# Patient Record
Sex: Male | Born: 1990 | Race: White | Hispanic: No | Marital: Single | Smoking: Never smoker
Health system: Southern US, Community
[De-identification: ages and names within clinical notes are randomized; demographics above are authoritative.]

## PROBLEM LIST (undated history)

## (undated) ENCOUNTER — Ambulatory Visit (HOSPITAL_COMMUNITY): Disposition: A | Discharge status: Home / Self Care | Payer: Self-pay

---
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
## 2011-01-31 ENCOUNTER — Emergency Department (HOSPITAL_COMMUNITY): Payer: 59

## 2011-01-31 ENCOUNTER — Emergency Department (HOSPITAL_COMMUNITY)
Admission: EM | Admit: 2011-01-31 | Discharge: 2011-01-31 | Disposition: A | Discharge status: Home / Self Care | Payer: 59 | Attending: Emergency Medicine | Admitting: Emergency Medicine

## 2011-01-31 DIAGNOSIS — M79609 Pain in unspecified limb: Secondary | ICD-10-CM | POA: Insufficient documentation

## 2011-01-31 DIAGNOSIS — S62309A Unspecified fracture of unspecified metacarpal bone, initial encounter for closed fracture: Secondary | ICD-10-CM | POA: Insufficient documentation

## 2011-01-31 DIAGNOSIS — M7989 Other specified soft tissue disorders: Secondary | ICD-10-CM | POA: Insufficient documentation

## 2011-01-31 DIAGNOSIS — X838XXA Intentional self-harm by other specified means, initial encounter: Secondary | ICD-10-CM | POA: Insufficient documentation

## 2011-01-31 LAB — GLUCOSE, CAPILLARY: Glucose-Capillary: 75 mg/dL (ref 70–99)

## 2019-03-08 ENCOUNTER — Emergency Department (HOSPITAL_COMMUNITY)
Admission: EM | Admit: 2019-03-08 | Discharge: 2019-03-08 | Disposition: A | Discharge status: Home / Self Care | Payer: 59 | Attending: Emergency Medicine | Admitting: Emergency Medicine

## 2019-03-08 ENCOUNTER — Encounter (HOSPITAL_COMMUNITY): Payer: Self-pay

## 2019-03-08 ENCOUNTER — Other Ambulatory Visit: Payer: Self-pay

## 2019-03-08 DIAGNOSIS — S80811A Abrasion, right lower leg, initial encounter: Secondary | ICD-10-CM | POA: Insufficient documentation

## 2019-03-08 DIAGNOSIS — W5911XA Bitten by nonvenomous snake, initial encounter: Secondary | ICD-10-CM

## 2019-03-08 DIAGNOSIS — X58XXXA Exposure to other specified factors, initial encounter: Secondary | ICD-10-CM | POA: Diagnosis not present

## 2019-03-08 DIAGNOSIS — Y93H2 Activity, gardening and landscaping: Secondary | ICD-10-CM | POA: Insufficient documentation

## 2019-03-08 DIAGNOSIS — Y92017 Garden or yard in single-family (private) house as the place of occurrence of the external cause: Secondary | ICD-10-CM | POA: Diagnosis not present

## 2019-03-08 DIAGNOSIS — Y999 Unspecified external cause status: Secondary | ICD-10-CM | POA: Diagnosis not present

## 2019-03-08 DIAGNOSIS — S8991XA Unspecified injury of right lower leg, initial encounter: Secondary | ICD-10-CM | POA: Diagnosis present

## 2019-03-08 NOTE — Discharge Instructions (Addendum)
If you were bitten by a snake, it appears that no envenomation occurred.  Please continue to monitor your symptoms, if you notice significant swelling, redness, or increased pain return to the emergency department.

## 2019-03-08 NOTE — ED Triage Notes (Signed)
Pt arrives POV for eval of R lateral leg pain d/t suspected snake bit to R leg. Pt states he felt sharp pain while mowing lawn earlier, but did not see what bit him, Pt states that it was approx 3-4 hours ago. 2 small red marks noted to R leg w/ no surrounding erythema, edema, streaking. Denies acute pain.

## 2019-03-08 NOTE — ED Provider Notes (Signed)
[facility] EMERGENCY DEPARTMENT Provider Note   CSN: [id number] Arrival date & time: 03/08/19  2146     History   Chief Complaint Chief Complaint  Patient presents with  . Snake Bite    HPI Carlos Pearson is a 28 y.o. male.     Patient presents to the emergency department with a chief complaint of questionable snake bite.  He states he was mowing grass and using a string tremor in long weeds, and may have been bitten by a snake.  He states that the injury occurred approximately 3 to 4 hours ago.  He states that after he finished she noticed 2 small abrasions on his right lower leg.  He denies any swelling or erythema.  Denies any pain.  The history is provided by the patient. No language interpreter was used.    History reviewed. No pertinent past medical history.  There are no active problems to display for this patient.   History reviewed. No pertinent surgical history.      Home Medications    Prior to Admission medications   Not on File    Family History History reviewed. No pertinent family history.  Social History Social History   Tobacco Use  . Smoking status: Never Smoker  . Smokeless tobacco: Never Used  Substance Use Topics  . Alcohol use: Yes  . Drug use: Never     Allergies   Patient has no known allergies.   Review of Systems Review of Systems  All other systems reviewed and are negative.    Physical Exam Updated Vital Signs BP 124/72   Pulse 74   Temp 98.2 F (36.8 C) (Oral)   Resp 18   Ht 6\' 2"  (1.88 m)   Wt 81.6 kg   SpO2 98%   BMI 23.11 kg/m   Physical Exam Vitals signs and nursing note reviewed.  Constitutional:      Appearance: He is well-developed.  HENT:     Head: Normocephalic and atraumatic.  Eyes:     Conjunctiva/sclera: Conjunctivae normal.  Neck:     Musculoskeletal: Neck supple.  Cardiovascular:     Rate and Rhythm: Normal rate and regular rhythm.     Heart sounds: No murmur.   Pulmonary:     Effort: Pulmonary effort is normal. No respiratory distress.     Breath sounds: Normal breath sounds.  Abdominal:     Palpations: Abdomen is soft.     Tenderness: There is no abdominal tenderness.  Skin:    General: Skin is warm and dry.     Comments: 2 small abrasions to the right ankle, the abrasions do not appear like punctures, the distance between the abrasions is larger than what I would expect for a copperhead bite, there is no surrounding inflammatory changes of the skin, no swelling, no erythema, no pain  Neurological:     Mental Status: He is alert and oriented to person, place, and time.  Psychiatric:        Mood and Affect: Mood normal.        Behavior: Behavior normal.        Thought Content: Thought content normal.        Judgment: Judgment normal.      ED Treatments / Results  Labs (all labs ordered are listed, but only abnormal results are displayed) Labs Reviewed - No data to display  EKG    Radiology No results found.  Procedures Procedures (including critical care time)  Medications Ordered in ED Medications - No data to display   Initial Impression / Assessment and Plan / ED Course  I have reviewed the triage vital signs and the nursing notes.  Pertinent labs & imaging results that were available during my care of the patient were reviewed by me and considered in my medical decision making (see chart for details).        Patient with questionable snakebite.  Based on physical exam, I doubt that the patient was bitten by a snake, and if he was, there is no evidence of envenomation.  He has no local inflammatory changes.  His vital signs are stable.  The injury occurred approximately 3 to 4 hours ago.  Patient is comfortable with being discharged.  I have given him specific return precautions.  He understands agrees the plan.  Final Clinical Impressions(s) / ED Diagnoses   Final diagnoses:  Snake bite, initial encounter    ED  Discharge Orders    None       Roxy HorsemanBrowning, Vinetta Brach, PA-C 03/08/19 2323    Arby BarrettePfeiffer, Marcy, MD 03/17/19 [phone]

## 2021-11-04 ENCOUNTER — Encounter: Payer: Self-pay | Admitting: Emergency Medicine

## 2021-11-04 ENCOUNTER — Emergency Department
Admission: EM | Admit: 2021-11-04 | Discharge: 2021-11-04 | Disposition: A | Discharge status: Home / Self Care | Payer: BC Managed Care – PPO | Attending: Emergency Medicine | Admitting: Emergency Medicine

## 2021-11-04 ENCOUNTER — Emergency Department: Payer: BC Managed Care – PPO

## 2021-11-04 ENCOUNTER — Other Ambulatory Visit: Payer: Self-pay

## 2021-11-04 DIAGNOSIS — M545 Low back pain, unspecified: Secondary | ICD-10-CM | POA: Diagnosis present

## 2021-11-04 DIAGNOSIS — M5126 Other intervertebral disc displacement, lumbar region: Secondary | ICD-10-CM

## 2021-11-04 DIAGNOSIS — M519 Unspecified thoracic, thoracolumbar and lumbosacral intervertebral disc disorder: Secondary | ICD-10-CM | POA: Insufficient documentation

## 2021-11-04 MED ORDER — OXYCODONE-ACETAMINOPHEN 5-325 MG PO TABS
1.0000 | ORAL_TABLET | Freq: Once | ORAL | Status: AC
  Administered 2021-11-04: 1 via ORAL
  Filled 2021-11-04: qty 1

## 2021-11-04 MED ORDER — OXYCODONE-ACETAMINOPHEN 5-325 MG PO TABS: 1.0000 | ORAL_TABLET | Freq: Four times a day (QID) | ORAL | 0 refills | Status: AC | PRN

## 2021-11-04 MED ORDER — METHOCARBAMOL 500 MG PO TABS: 500.0000 mg | ORAL_TABLET | Freq: Four times a day (QID) | ORAL | 1 refills | Status: AC

## 2021-11-04 MED ORDER — PREDNISONE 20 MG PO TABS
60.0000 mg | ORAL_TABLET | Freq: Once | ORAL | Status: AC
  Administered 2021-11-04: 60 mg via ORAL
  Filled 2021-11-04: qty 3

## 2021-11-04 MED ORDER — MELOXICAM 15 MG PO TABS: 15.0000 mg | ORAL_TABLET | Freq: Every day | ORAL | 0 refills | Status: AC

## 2021-11-04 MED ORDER — PREDNISONE 10 MG PO TABS: 10.0000 mg | ORAL_TABLET | ORAL | 0 refills | Status: AC

## 2021-11-04 MED ORDER — METHOCARBAMOL 500 MG PO TABS
1000.0000 mg | ORAL_TABLET | Freq: Once | ORAL | Status: AC
  Administered 2021-11-04: 1000 mg via ORAL
  Filled 2021-11-04: qty 2

## 2021-11-04 MED ORDER — MELOXICAM 7.5 MG PO TABS
15.0000 mg | ORAL_TABLET | Freq: Once | ORAL | Status: AC
  Administered 2021-11-04: 15 mg via ORAL
  Filled 2021-11-04: qty 2

## 2021-11-04 NOTE — ED Notes (Signed)
See triage note  presents with lower back pain  states pain started while at work today  hx of sciatica  ambulates with

## 2021-11-04 NOTE — ED Provider Notes (Signed)
[facility] Provider Note  Patient Contact: 3:44 PM (approximate)   History   Back Pain   HPI  Carlos Pearson is a 31 y.o. male who presents the emergency department with worsening lower back pain.  Patient states that he sustained an injury to his back roughly a year ago.  He has had some intermittent issues with his back and has been seeing a chiropractor for the last several weeks for increasing back pain.  He had seen him twice this past week and was referred to orthopedics from the chiropractor.  He went to urgent care 4 days ago, received a prednisone taper for his lower back.  He states that today he was at work, was on the ground using a wrench when he felt increasing pain in his lower back.  He used lidocaine patch to the area, presents for evaluation.  Denies any bowel or bladder dysfunction, saddle anesthesia or paresthesias.  No new injury/direct trauma to his back.  No other complaints at this time.  Patient denies any urinary or GI symptoms.     Physical Exam   Triage Vital Signs: ED Triage Vitals [11/04/21 1321]  Enc Vitals Group     BP (!) 146/91     Pulse Rate 91     Resp 16     Temp 98.5 F (36.9 C)     Temp Source Oral     SpO2 100 %     Weight 170 lb (77.1 kg)     Height 6\' 2"  (1.88 m)     Head Circumference      Peak Flow      Pain Score 7     Pain Loc      Pain Edu?      Excl. in [place]?     Most recent vital signs: Vitals:   11/04/21 1321  BP: (!) 146/91  Pulse: 91  Resp: 16  Temp: 98.5 F (36.9 C)  SpO2: 100%     General: Alert and in no acute distress.  Cardiovascular:  Good peripheral perfusion Respiratory: Normal respiratory effort without tachypnea or retractions. Lungs CTAB.  Gastrointestinal: Bowel sounds 4 quadrants. Soft and nontender to palpation. No guarding or rigidity. No palpable masses. No distention. No CVA tenderness. Musculoskeletal: Full range of motion to all extremities.  Visualization of the  lumbar spine reveals no visible signs of trauma with abrasions, lacerations, ecchymosis.  Tender in the lower back extending into left paraspinal muscle group, SI joint.  No palpable abnormality or step-off.  Patient is ambulatory.  No loss of sensation in either lower extremity when compared with the opposite side.  Dorsalis pedis pulse intact bilateral lower extremities. Neurologic:  No gross focal neurologic deficits are appreciated.  Skin:   No rash noted Other:   ED Results / Procedures / Treatments   Labs (all labs ordered are listed, but only abnormal results are displayed) Labs Reviewed - No data to display   EKG     RADIOLOGY  I personally viewed and evaluated these images as part of my medical decision making, as well as reviewing the written report by the radiologist.  ED Provider Interpretation: Visualization of the CT scan reveals what appears to be herniated disc with no central quadrant compression.  No evidence of fracture  CT Lumbar Spine Wo Contrast  Result Date: 11/04/2021 CLINICAL DATA:  Low back pain, symptoms persist with > 6 wks treatment Lumbar radiculopathy, symptoms persist with > 6 wks treatment  EXAM: CT LUMBAR SPINE WITHOUT CONTRAST TECHNIQUE: Multidetector CT imaging of the lumbar spine was performed without intravenous contrast administration. Multiplanar CT image reconstructions were also generated. RADIATION DOSE REDUCTION: This exam was performed according to the departmental dose-optimization program which includes automated exposure control, adjustment of the mA and/or kV according to patient size and/or use of iterative reconstruction technique. COMPARISON:  None. FINDINGS: Segmentation: 5 lumbar type vertebrae. Alignment: Normal. Vertebrae: No acute lumbar spine fracture. No aggressive osseous lesion. Paraspinal and other soft tissues: Negative Disc levels: There is evidence of a large left paracentral disc herniation resulting in severe left lateral  recess narrowing at L4-L5 (series 7, images 56-57 and series 4, image 98). Milder central disc bulging at L5-S1. Minimal multilevel facet arthropathy. IMPRESSION: Large left paracentral disc herniation at L4-L5 resulting in severe left lateral recess narrowing and probable nerve root impingement. No evidence of lumbar spine fracture. Electronically Signed   By: Maurine Simmering M.D.   On: 11/04/2021 16:36    PROCEDURES:  Critical Care performed: No  Procedures   MEDICATIONS ORDERED IN ED: Medications  oxyCODONE-acetaminophen (PERCOCET/ROXICET) 5-325 MG per tablet 1 tablet (has no administration in time range)  methocarbamol (ROBAXIN) tablet 1,000 mg (has no administration in time range)  meloxicam (MOBIC) tablet 15 mg (has no administration in time range)  predniSONE (DELTASONE) tablet 60 mg (has no administration in time range)     IMPRESSION / MDM / ASSESSMENT AND PLAN / ED COURSE  I reviewed the triage vital signs and the nursing notes.                              Differential diagnosis includes, but is not limited to, herniated disc, lumbar strain, compression fracture   Patient's diagnosis is consistent with herniated disc.  Patient presented to the emergency department after sustaining an injury roughly a year ago.  Over the past couple weeks he has had some increasing back pain, has seen chiropractor.  Chiropractor had referred him to orthopedics for further evaluation but he had worsening pain and went to urgent care 3 days ago.  He has had lidocaine patches and prednisone but today while working he had a sudden increase in the pain.  There was no direct trauma to his spine.  He has no bowel or bladder dysfunction, saddle anesthesia or paresthesias.  Overall exam was reassuring with no neurodeficits identified.  Patient had a CT scan of his lumbar spine which revealed herniated disc, no evidence of compression fracture and no evidence of central cord impingement.  As he is  neurologically intact I will discharge the patient home with medications for symptom control to include pain medication, prednisone, anti-inflammatory and muscle relaxer.  Referral to neurosurgery is placed for the patient..  Return precautions discussed with the patient.  Patient is given ED precautions to return to the ED for any worsening or new symptoms.        FINAL CLINICAL IMPRESSION(S) / ED DIAGNOSES   Final diagnoses:  Herniated lumbar intervertebral disc     Rx / DC Orders   ED Discharge Orders          Ordered    meloxicam (MOBIC) 15 MG tablet  Daily        11/04/21 1721    predniSONE (DELTASONE) 10 MG tablet  As directed       Note to Pharmacy: Take on a pattern of 6, 6, 5, 5, 4, 4,  3, 3, 2, 2, 1, 1   11/04/21 1721    methocarbamol (ROBAXIN) 500 MG tablet  4 times daily        11/04/21 1721    oxyCODONE-acetaminophen (PERCOCET/ROXICET) 5-325 MG tablet  Every 6 hours PRN        11/04/21 1724             Note:  This document was prepared using Dragon voice recognition software and may include unintentional dictation errors.   Darletta Moll, PA-C 11/04/21 1726    Arta Silence, MD 11/04/21 2342

## 2021-11-04 NOTE — ED Triage Notes (Signed)
FIRST NURSE NOTE: pt via EMS from work. Pt c/o back pain while he was at work. Pt has a hx of sciatica. Pt placed a lidocaine patch PTA. Pt is on steroids for this. Pt is A&Ox4 and NAD.   180/84 108 HR 97%  22 RR.

## 2022-08-29 IMAGING — CT CT L SPINE W/O CM
3 of 5 series · 13 of 36 positions shown, 14 images · non-contrast
Comparison: None.

CLINICAL DATA: Low back pain, symptoms persist with > 6 wks
treatment Lumbar radiculopathy, symptoms persist with > 6 wks
treatment



[Series 4: multi disc · axial · 0.21mm/px · z∈[-899,-735]mm · 4 of 126 slices shown, 5 images]
[im 26/126  soft-tissue]
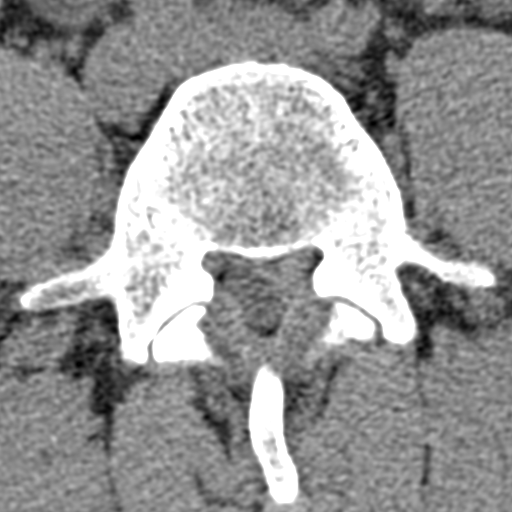
[im 26/126  bone]
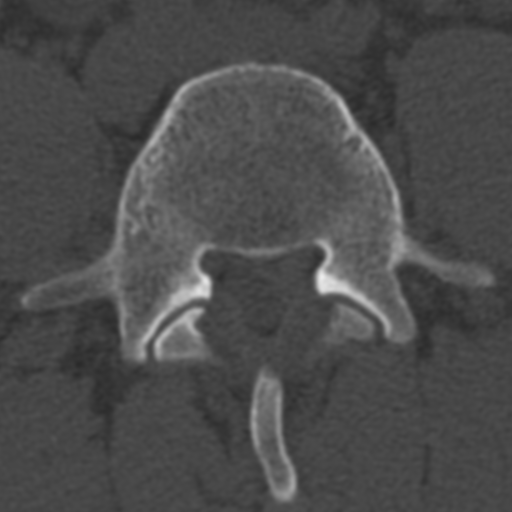
[im 51/126  bone]
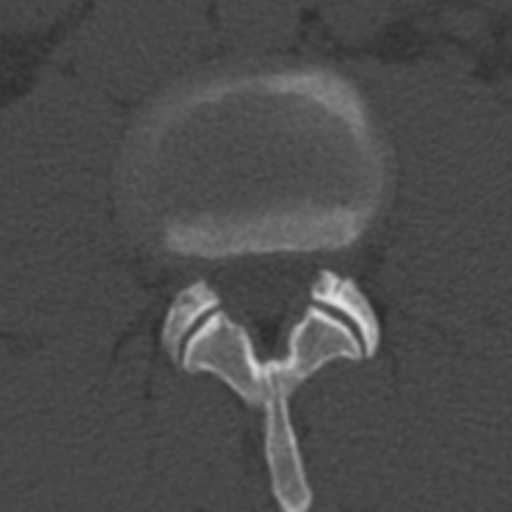
[im 76/126  bone]
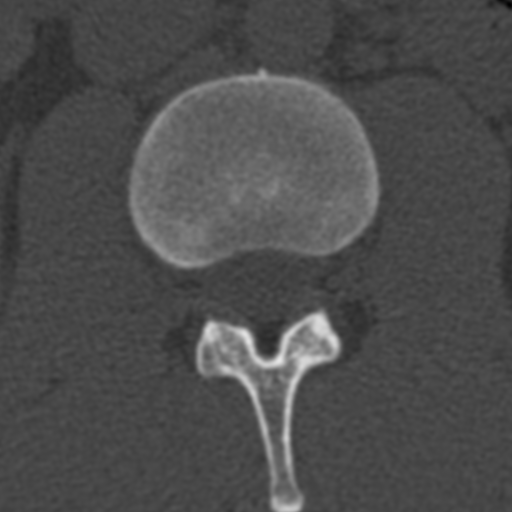
[im 101/126  bone]
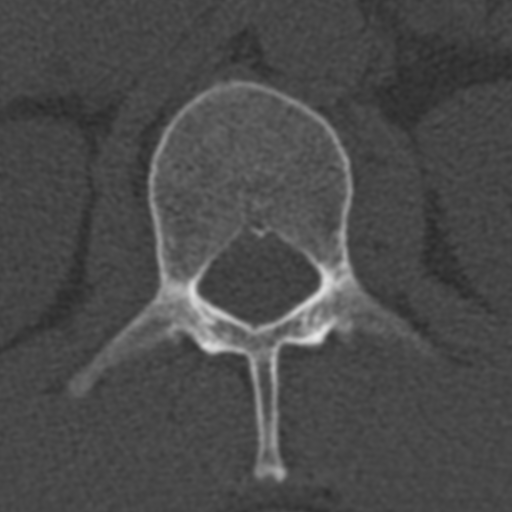

[Series 5: sagittal bone · sagittal · 0.32mm/px · 6 of 100 slices shown]
[im 10/100  soft-tissue]
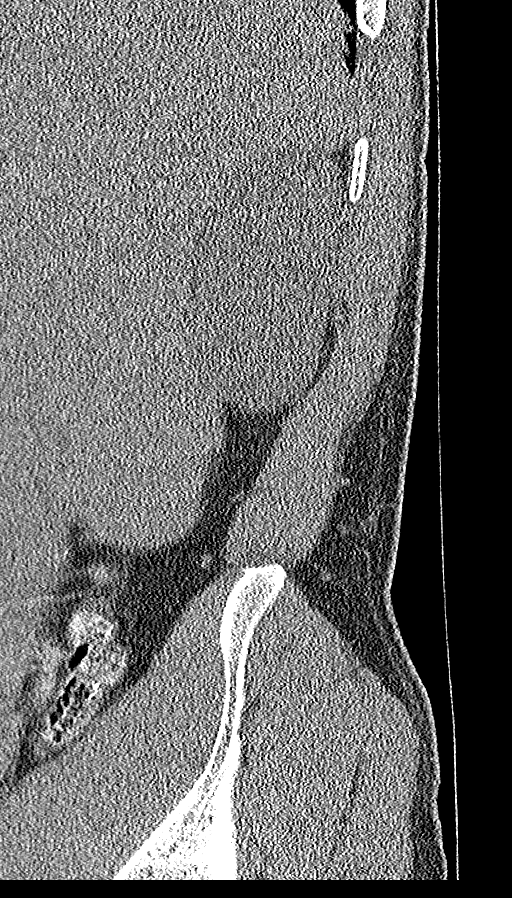
[im 17/100  bone]
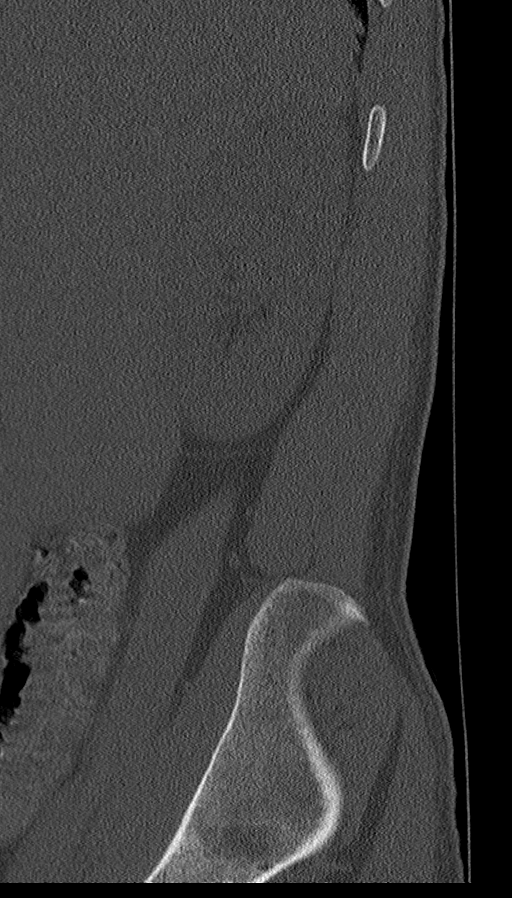
[im 34/100  bone]
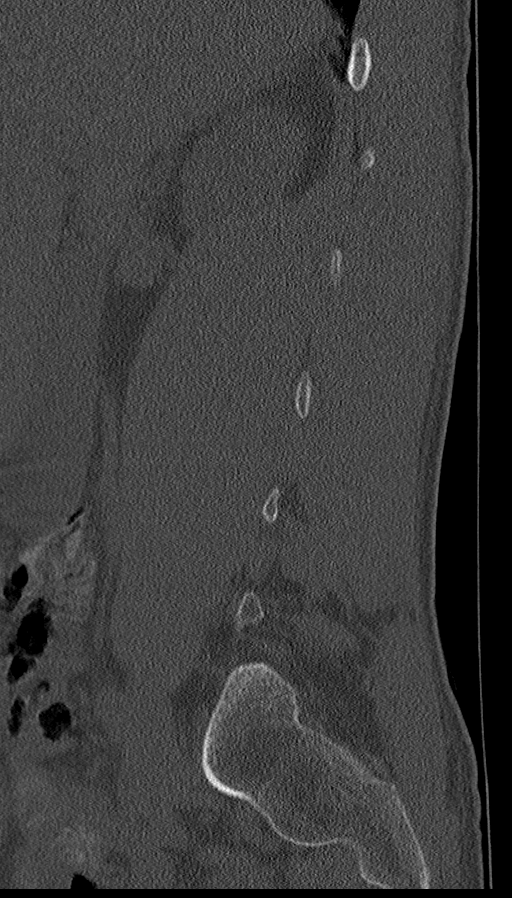
[im 50/100  bone]
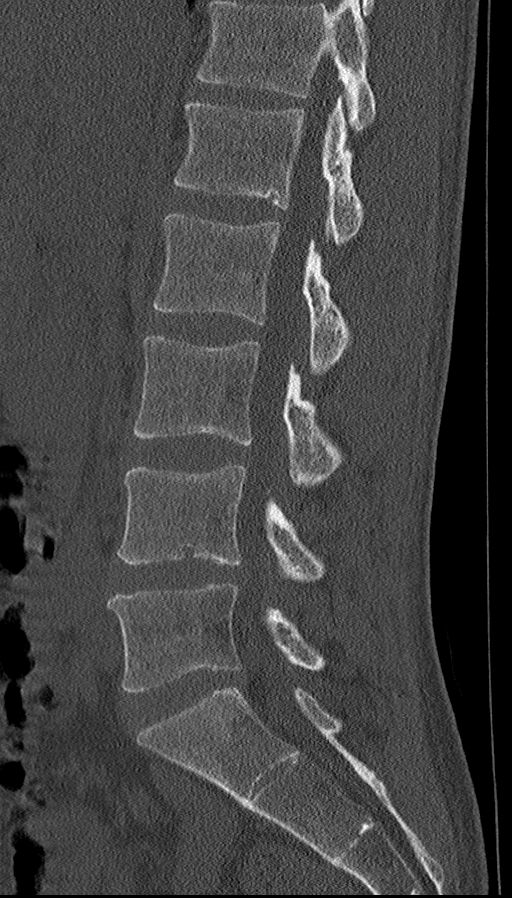
[im 67/100  bone]
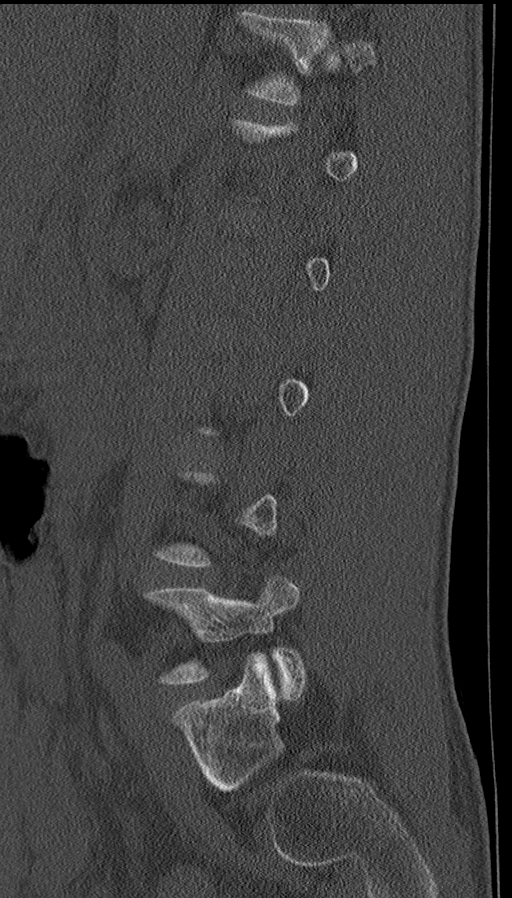
[im 83/100  bone]
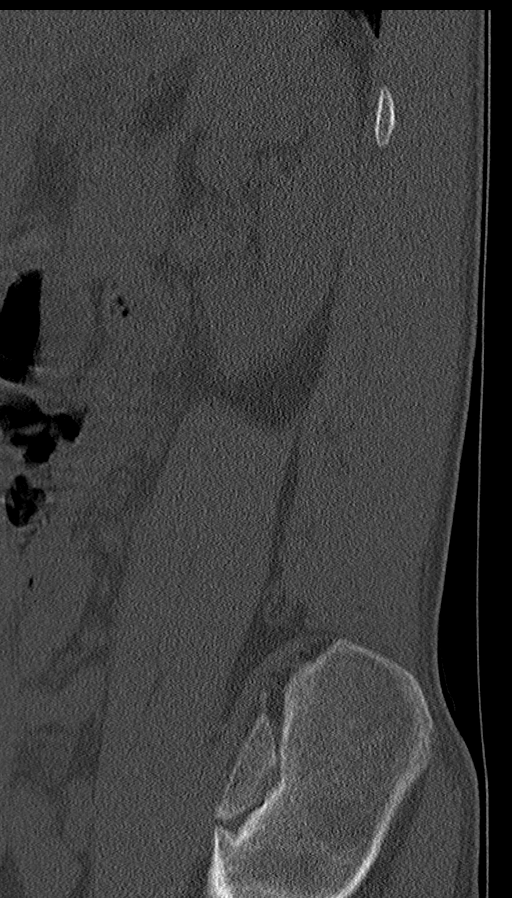

[Series 8: coronal bone · coronal · 0.39mm/px · 3 of 84 slices shown]
[im 17/84  bone]
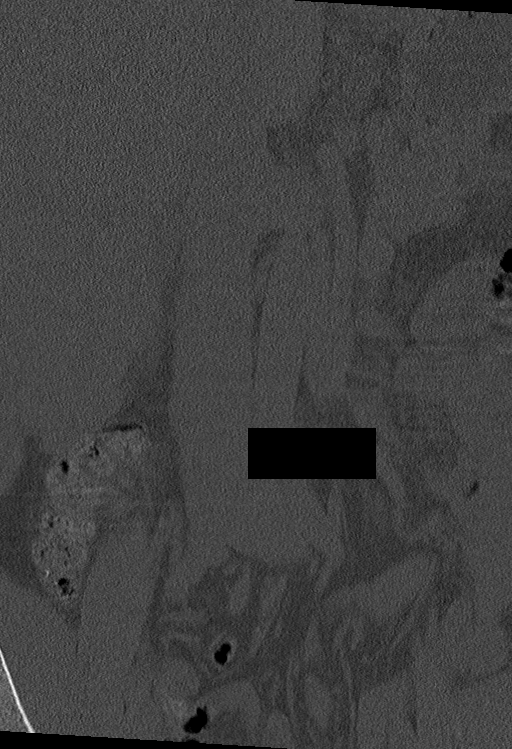
[im 34/84  bone]
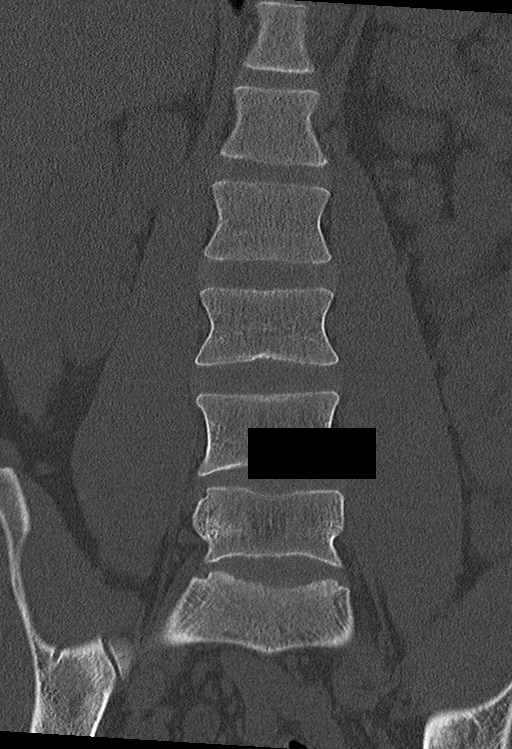
[im 50/84  bone]
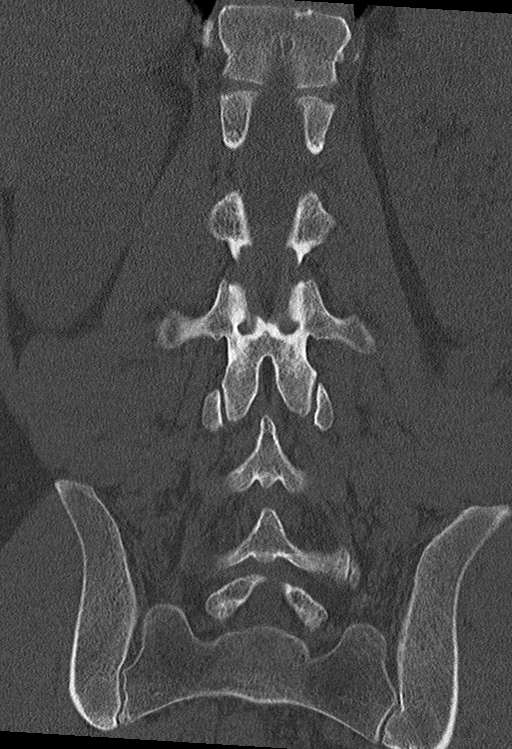

[13 of 36 positions shown; findings below may reference images not displayed]

FINDINGS: Segmentation: 5 lumbar type vertebrae.

Alignment: Normal.

Vertebrae: No acute lumbar spine fracture. No aggressive osseous
lesion.

Paraspinal and other soft tissues: Negative

Disc levels: There is evidence of a large left paracentral disc
herniation resulting in severe left lateral recess narrowing at
L4-L5 (series 7, images 56-57 and series 4, image 98). Milder
central disc bulging at L5-S1. Minimal multilevel facet arthropathy.
IMPRESSION: Large left paracentral disc herniation at L4-L5 resulting in severe
left lateral recess narrowing and probable nerve root impingement.

No evidence of lumbar spine fracture.
